# Patient Record
Sex: Male | Born: 2000 | Race: Black or African American | Hispanic: No | Marital: Single | Smoking: Never smoker
Health system: Southern US, Community
[De-identification: ages and names within clinical notes are randomized; demographics above are authoritative.]

## PROBLEM LIST (undated history)

## (undated) HISTORY — PX: TONSILLECTOMY: SUR1361

## (undated) HISTORY — PX: ADENOIDECTOMY: SUR15

---
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
## 2022-01-27 DIAGNOSIS — S6992XA Unspecified injury of left wrist, hand and finger(s), initial encounter: Secondary | ICD-10-CM | POA: Diagnosis not present

## 2022-08-11 DIAGNOSIS — L600 Ingrowing nail: Secondary | ICD-10-CM | POA: Diagnosis not present
